# Patient Record
Sex: Female | Born: 1975 | Race: White | Hispanic: No | Marital: Married | Smoking: Never smoker
Health system: Southern US, Community
[De-identification: ages and names within clinical notes are randomized; demographics above are authoritative.]

## PROBLEM LIST (undated history)

## (undated) DIAGNOSIS — M1990 Unspecified osteoarthritis, unspecified site: Secondary | ICD-10-CM

## (undated) DIAGNOSIS — G43909 Migraine, unspecified, not intractable, without status migrainosus: Secondary | ICD-10-CM

## (undated) DIAGNOSIS — R202 Paresthesia of skin: Secondary | ICD-10-CM

## (undated) HISTORY — DX: Migraine, unspecified, not intractable, without status migrainosus: G43.909

## (undated) HISTORY — DX: Paresthesia of skin: R20.2

## (undated) HISTORY — DX: Unspecified osteoarthritis, unspecified site: M19.90

---

## 2004-11-20 ENCOUNTER — Other Ambulatory Visit: Admission: RE | Admit: 2004-11-20 | Discharge: 2004-11-20 | Payer: Self-pay | Admitting: Obstetrics and Gynecology

## 2005-11-19 ENCOUNTER — Other Ambulatory Visit: Admission: RE | Admit: 2005-11-19 | Discharge: 2005-11-19 | Payer: Self-pay | Admitting: *Deleted

## 2008-10-24 ENCOUNTER — Inpatient Hospital Stay (HOSPITAL_COMMUNITY): Admission: AD | Admit: 2008-10-24 | Discharge: 2008-10-26 | Payer: Self-pay | Admitting: Obstetrics and Gynecology

## 2011-01-08 LAB — CBC
HCT: 37.7 % (ref 36.0–46.0)
Hemoglobin: 12.8 g/dL (ref 12.0–15.0)
MCHC: 34 g/dL (ref 30.0–36.0)
MCV: 91.8 fL (ref 78.0–100.0)
Platelets: 130 10*3/uL — ABNORMAL LOW (ref 150–400)
RBC: 4.1 MIL/uL (ref 3.87–5.11)
RDW: 13.8 % (ref 11.5–15.5)
WBC: 15.4 10*3/uL — ABNORMAL HIGH (ref 4.0–10.5)

## 2011-01-08 LAB — RPR: RPR Ser Ql: NONREACTIVE

## 2011-01-09 LAB — CBC
HCT: 30.5 % — ABNORMAL LOW (ref 36.0–46.0)
Hemoglobin: 10.5 g/dL — ABNORMAL LOW (ref 12.0–15.0)
RBC: 3.29 MIL/uL — ABNORMAL LOW (ref 3.87–5.11)
RDW: 13.8 % (ref 11.5–15.5)
WBC: 16.7 10*3/uL — ABNORMAL HIGH (ref 4.0–10.5)

## 2011-09-25 HISTORY — PX: TONSILLECTOMY: SUR1361

## 2018-07-21 ENCOUNTER — Encounter: Payer: Self-pay | Admitting: *Deleted

## 2018-07-23 ENCOUNTER — Ambulatory Visit: Payer: Federal, State, Local not specified - PPO | Admitting: Diagnostic Neuroimaging

## 2018-07-23 ENCOUNTER — Encounter: Payer: Self-pay | Admitting: Diagnostic Neuroimaging

## 2018-07-23 VITALS — BP 122/83 | HR 88 | Ht 62.0 in | Wt 161.0 lb

## 2018-07-23 DIAGNOSIS — R200 Anesthesia of skin: Secondary | ICD-10-CM | POA: Diagnosis not present

## 2018-07-23 NOTE — Progress Notes (Signed)
[facility]  PATIENT: Linda Pierce DOB: 1976-03-21  REFERRING CLINICIAN: Glynda Jaeger HISTORY FROM: patient  REASON FOR VISIT: new consult    HISTORICAL  CHIEF COMPLAINT:  Chief Complaint  Patient presents with  . Right arm numbness    rm 7, New Pt, "right arm numbness, pins/needles, intermittent since early August"    HISTORY OF PRESENT ILLNESS:   42 year old female here for evaluation of numbness and tingling.  August 2019 patient noticed onset of intermittent numbness and tingling sensation in her right upper arm from her right shoulder to her right elbow.  Symptoms could last minutes up to several hours at a time.  Symptoms occurring multiple times per day.  Symptoms seem to be worse if she lays on her right side or moves her arm in a certain position.  Stretching for moving her head and right arm seem to alleviate symptoms.  Symptoms are fairly stable to slightly improved since August 2019.  No weakness in the right arm.  No neck pain.  No symptoms in the face, left arm, bilateral lower extremities.  No specific triggering or aggravating factors otherwise noted.  No prodromal accidents, injuries or traumas.   REVIEW OF SYSTEMS: Full 14 system review of systems performed and negative with exception of: Numbness.  ALLERGIES: Allergies  Allergen Reactions  . Montelukast Other (See Comments)    Headache  . Penicillins Hives    HOME MEDICATIONS: Outpatient Medications Prior to Visit  Medication Sig Dispense Refill  . CAMILA 0.35 MG tablet Take 1 tablet by mouth daily.  3  . Cetirizine HCl 10 MG CAPS Take by mouth as needed. As needed    . diclofenac (VOLTAREN) 75 MG EC tablet Take by mouth as needed.    . diclofenac sodium (VOLTAREN) 1 % GEL as needed.    . eletriptan (RELPAX) 40 MG tablet TAKE 1 TABLET (40 MG TOTAL) BY MOUTH AS NEEDED. MAY REPEAT IN 2 HOURS IF NECESSARY  0  . meclizine (ANTIVERT) 25 MG tablet Take by mouth as needed.    Marland Kitchen scopolamine  (TRANSDERM-SCOP) 1 MG/3DAYS Place onto the skin as needed.     No facility-administered medications prior to visit.     PAST MEDICAL HISTORY: Past Medical History:  Diagnosis Date  . Arthritis    knee, left big toe  . Migraines    monthly acupuncture, trigger by hormones  . Paresthesia of arm    right    PAST SURGICAL HISTORY: Past Surgical History:  Procedure Laterality Date  . TONSILLECTOMY  2013    FAMILY HISTORY: Family History  Problem Relation Age of Onset  . Heart Problems Father        artificial aortic valve  . Cancer Paternal Uncle        pancreatic  . Breast cancer Maternal Grandmother   . Cancer Paternal Grandmother        brain  . Cancer Paternal Uncle        colon    SOCIAL HISTORY: Social History   Socioeconomic History  . Marital status: Married    Spouse name: Janyth Pupa  . Number of children: 1  . Years of education: Not on file  . Highest education level: Master's degree (e.g., MA, MS, MEng, MEd, MSW, MBA)  Occupational History    Comment: [employer]  Social Needs  . Financial resource strain: Not on file  . Food insecurity:    Worry: Not on file    Inability: Not on file  .  Transportation needs:    Medical: Not on file    Non-medical: Not on file  Tobacco Use  . Smoking status: Never Smoker  . Smokeless tobacco: Never Used  Substance and Sexual Activity  . Alcohol use: Yes    Comment: 07/23/18 < 1 a day  . Drug use: Never  . Sexual activity: Not on file  Lifestyle  . Physical activity:    Days per week: Not on file    Minutes per session: Not on file  . Stress: Not on file  Relationships  . Social connections:    Talks on phone: Not on file    Gets together: Not on file    Attends religious service: Not on file    Active member of club or organization: Not on file    Attends meetings of clubs or organizations: Not on file    Relationship status: Not on file  . Intimate partner violence:    Fear of current or ex partner: Not on  file    Emotionally abused: Not on file    Physically abused: Not on file    Forced sexual activity: Not on file  Other Topics Concern  . Not on file  Social History Narrative   Lives with husband, child    Caffeine- coffee, 1 cup, sodas 1-4 a day     PHYSICAL EXAM  GENERAL EXAM/CONSTITUTIONAL: Vitals:  Vitals:   07/23/18 0957  BP: 122/83  Pulse: 88  Weight: 161 lb (73 kg)  Height: 5\' 2"  (1.575 m)     Body mass index is 29.45 kg/m. Wt Readings from Last 3 Encounters:  07/23/18 161 lb (73 kg)     Patient is in no distress; well developed, nourished and groomed; neck is supple  CARDIOVASCULAR:  Examination of carotid arteries is normal; no carotid bruits  Regular rate and rhythm, no murmurs  Examination of peripheral vascular system by observation and palpation is normal  EYES:  Ophthalmoscopic exam of optic discs and posterior segments is normal; no papilledema or hemorrhages  Visual Acuity Screening   Right eye Left eye Both eyes  Without correction:     With correction: 20/30 20/20      MUSCULOSKELETAL:  Gait, strength, tone, movements noted in Neurologic exam below  NEUROLOGIC: MENTAL STATUS:  No flowsheet data found.  awake, alert, oriented to person, place and time  recent and remote memory intact  normal attention and concentration  language fluent, comprehension intact, naming intact  fund of knowledge appropriate  CRANIAL NERVE:   2nd - no papilledema on fundoscopic exam  2nd, 3rd, 4th, 6th - pupils equal and reactive to light, visual fields full to confrontation, extraocular muscles intact, no nystagmus  5th - facial sensation symmetric  7th - facial strength symmetric  8th - hearing intact  9th - palate elevates symmetrically, uvula midline  11th - shoulder shrug symmetric  12th - tongue protrusion midline  MOTOR:   normal bulk and tone, full strength in the BUE, BLE  SENSORY:   normal and symmetric to light touch,  temperature, vibration, pinprick  COORDINATION:   finger-nose-finger, fine finger movements normal  REFLEXES:   deep tendon reflexes present and symmetric  GAIT/STATION:   narrow based gait; able tandem; romberg is negative     DIAGNOSTIC DATA (LABS, IMAGING, TESTING) - I reviewed patient records, labs, notes, testing and imaging myself where available.  Lab Results  Component Value Date   WBC 16.7 (H) 10/25/2008   HGB 10.5 DELTA  CHECK NOTED (L) 10/25/2008   HCT 30.5 (L) 10/25/2008   MCV 92.6 10/25/2008   PLT 112 (L) 10/25/2008   No results found for: NA, K, CL, CO2, GLUCOSE, BUN, CREATININE, CALCIUM, PROT, ALBUMIN, AST, ALT, ALKPHOS, BILITOT, GFRNONAA, GFRAA No results found for: CHOL, HDL, LDLCALC, LDLDIRECT, TRIG, CHOLHDL No results found for: ZOXW9U No results found for: VITAMINB12 No results found for: TSH     ASSESSMENT AND PLAN  42 y.o. year old female here with mild intermittent numbness and tingling in right upper extremity since August 2019.  Symptoms seem to be related to position and pressure.  Could represent intermittent nerve or nerve root compression.  Neurologic examination unremarkable today.  Ddx: intermittent cervical radiculopathy vs peripheral neuropathy  1. Right arm numbness     PLAN:  - monitor symptoms for now over next 4-6 weeks - may consider EMG/NCS and MRI cervical spine if symptoms do not improve  Return if symptoms worsen or fail to improve.    Suanne Marker, MD 07/23/2018, 10:27 AM Certified in Neurology, Neurophysiology and Neuroimaging  [facility] [address] [place], [place] [address] [phone]

## 2018-07-23 NOTE — Patient Instructions (Addendum)
-   monitor symptoms over next 4-6 weeks  - optimize ergonomics, core exercises, stretching, yoga  - may consider EMG/NCS and MRI cervical spine

## 2019-07-27 ENCOUNTER — Other Ambulatory Visit: Payer: Self-pay | Admitting: Obstetrics and Gynecology

## 2019-07-27 DIAGNOSIS — R928 Other abnormal and inconclusive findings on diagnostic imaging of breast: Secondary | ICD-10-CM

## 2019-07-30 ENCOUNTER — Ambulatory Visit
Admission: RE | Admit: 2019-07-30 | Discharge: 2019-07-30 | Disposition: A | Discharge status: Home / Self Care | Payer: Federal, State, Local not specified - PPO | Source: Ambulatory Visit | Attending: Obstetrics and Gynecology | Admitting: Obstetrics and Gynecology

## 2019-07-30 ENCOUNTER — Other Ambulatory Visit: Payer: Self-pay

## 2019-07-30 DIAGNOSIS — R928 Other abnormal and inconclusive findings on diagnostic imaging of breast: Secondary | ICD-10-CM

## 2020-05-17 ENCOUNTER — Other Ambulatory Visit: Payer: Self-pay | Admitting: Obstetrics and Gynecology

## 2020-05-17 DIAGNOSIS — Z1231 Encounter for screening mammogram for malignant neoplasm of breast: Secondary | ICD-10-CM

## 2020-07-25 ENCOUNTER — Ambulatory Visit
Admission: RE | Admit: 2020-07-25 | Discharge: 2020-07-25 | Disposition: A | Discharge status: Home / Self Care | Payer: Federal, State, Local not specified - PPO | Source: Ambulatory Visit | Attending: Obstetrics and Gynecology | Admitting: Obstetrics and Gynecology

## 2020-07-25 ENCOUNTER — Other Ambulatory Visit: Payer: Self-pay

## 2020-07-25 DIAGNOSIS — Z1231 Encounter for screening mammogram for malignant neoplasm of breast: Secondary | ICD-10-CM

## 2020-10-12 ENCOUNTER — Encounter (HOSPITAL_BASED_OUTPATIENT_CLINIC_OR_DEPARTMENT_OTHER): Payer: Self-pay | Admitting: *Deleted

## 2020-10-12 ENCOUNTER — Other Ambulatory Visit: Payer: Self-pay

## 2020-10-12 ENCOUNTER — Emergency Department (HOSPITAL_BASED_OUTPATIENT_CLINIC_OR_DEPARTMENT_OTHER): Payer: Federal, State, Local not specified - PPO

## 2020-10-12 ENCOUNTER — Emergency Department (HOSPITAL_BASED_OUTPATIENT_CLINIC_OR_DEPARTMENT_OTHER)
Admission: EM | Admit: 2020-10-12 | Discharge: 2020-10-12 | Disposition: A | Discharge status: Home / Self Care | Payer: Federal, State, Local not specified - PPO | Attending: Emergency Medicine | Admitting: Emergency Medicine

## 2020-10-12 DIAGNOSIS — E876 Hypokalemia: Secondary | ICD-10-CM

## 2020-10-12 DIAGNOSIS — Z8616 Personal history of COVID-19: Secondary | ICD-10-CM | POA: Diagnosis not present

## 2020-10-12 DIAGNOSIS — F419 Anxiety disorder, unspecified: Secondary | ICD-10-CM | POA: Insufficient documentation

## 2020-10-12 DIAGNOSIS — R002 Palpitations: Secondary | ICD-10-CM | POA: Insufficient documentation

## 2020-10-12 DIAGNOSIS — R000 Tachycardia, unspecified: Secondary | ICD-10-CM | POA: Diagnosis not present

## 2020-10-12 LAB — COMPREHENSIVE METABOLIC PANEL
ALT: 26 U/L (ref 0–44)
AST: 25 U/L (ref 15–41)
Albumin: 4.5 g/dL (ref 3.5–5.0)
Alkaline Phosphatase: 84 U/L (ref 38–126)
Anion gap: 12 (ref 5–15)
BUN: 11 mg/dL (ref 6–20)
CO2: 24 mmol/L (ref 22–32)
Calcium: 8.9 mg/dL (ref 8.9–10.3)
Chloride: 100 mmol/L (ref 98–111)
Creatinine, Ser: 0.83 mg/dL (ref 0.44–1.00)
GFR, Estimated: >60 mL/min (ref >60)
Glucose, Bld: 118 mg/dL — ABNORMAL HIGH (ref 70–99)
Potassium: 2.4 mmol/L — CL (ref 3.5–5.1)
Sodium: 136 mmol/L (ref 135–145)
Total Bilirubin: 0.2 mg/dL — ABNORMAL LOW (ref 0.3–1.2)
Total Protein: 7.6 g/dL (ref 6.5–8.1)

## 2020-10-12 LAB — CBC WITH DIFFERENTIAL/PLATELET
Abs Immature Granulocytes: 0.06 10*3/uL (ref 0.00–0.07)
Basophils Absolute: 0.1 10*3/uL (ref 0.0–0.1)
Basophils Relative: 1 %
Eosinophils Absolute: 0.1 10*3/uL (ref 0.0–0.5)
Eosinophils Relative: 0 %
HCT: 42.4 % (ref 36.0–46.0)
Hemoglobin: 14.3 g/dL (ref 12.0–15.0)
Immature Granulocytes: 0 %
Lymphocytes Relative: 25 %
Lymphs Abs: 3.5 10*3/uL (ref 0.7–4.0)
MCH: 29.2 pg (ref 26.0–34.0)
MCHC: 33.7 g/dL (ref 30.0–36.0)
MCV: 86.7 fL (ref 80.0–100.0)
Monocytes Absolute: 0.8 10*3/uL (ref 0.1–1.0)
Monocytes Relative: 5 %
Neutro Abs: 9.9 10*3/uL — ABNORMAL HIGH (ref 1.7–7.7)
Neutrophils Relative %: 69 %
Platelets: 299 10*3/uL (ref 150–400)
RBC: 4.89 MIL/uL (ref 3.87–5.11)
RDW: 12.4 % (ref 11.5–15.5)
WBC: 14.5 10*3/uL — ABNORMAL HIGH (ref 4.0–10.5)
nRBC: 0 % (ref 0.0–0.2)

## 2020-10-12 LAB — TROPONIN I (HIGH SENSITIVITY): Troponin I (High Sensitivity): 3 ng/L (ref <18)

## 2020-10-12 LAB — D-DIMER, QUANTITATIVE: D-Dimer, Quant: 0.39 ug/mL-FEU (ref 0.00–0.50)

## 2020-10-12 MED ORDER — DOXYCYCLINE HYCLATE 100 MG PO CAPS: 100.0000 mg | ORAL_CAPSULE | Freq: Two times a day (BID) | ORAL | 0 refills | Status: AC

## 2020-10-12 MED ORDER — POTASSIUM CHLORIDE 10 MEQ/100ML IV SOLN
10.0000 meq | INTRAVENOUS | Status: AC
  Administered 2020-10-12 (×2): 10 meq via INTRAVENOUS
  Filled 2020-10-12 (×2): qty 100

## 2020-10-12 MED ORDER — POTASSIUM CHLORIDE CRYS ER 20 MEQ PO TBCR
40.0000 meq | EXTENDED_RELEASE_TABLET | Freq: Once | ORAL | Status: AC
  Administered 2020-10-12: 40 meq via ORAL
  Filled 2020-10-12: qty 2

## 2020-10-12 MED ORDER — POTASSIUM CHLORIDE ER 20 MEQ PO TBCR: 20.0000 meq | EXTENDED_RELEASE_TABLET | Freq: Every day | ORAL | 0 refills | Status: AC

## 2020-10-12 MED ORDER — LORAZEPAM 2 MG/ML IJ SOLN: 1.0000 mg | Freq: Once | INTRAMUSCULAR | Status: DC

## 2020-10-12 MED ORDER — POTASSIUM CHLORIDE ER 20 MEQ PO TBCR: 20.0000 meq | EXTENDED_RELEASE_TABLET | Freq: Every day | ORAL | 0 refills | Status: DC

## 2020-10-12 MED ORDER — SODIUM CHLORIDE 0.9 % IV BOLUS
1000.0000 mL | Freq: Once | INTRAVENOUS | Status: AC
  Administered 2020-10-12: 1000 mL via INTRAVENOUS

## 2020-10-12 NOTE — ED Triage Notes (Signed)
C/o palpations x 1 hr , denies SOb. HR 170 in triage

## 2020-10-12 NOTE — ED Notes (Signed)
ED Provider at bedside. 

## 2020-10-12 NOTE — ED Provider Notes (Signed)
[facility] EMERGENCY DEPARTMENT Provider Note   CSN: [id number] Arrival date & time: 10/12/20  1435     History Chief Complaint  Patient presents with  . Palpitations    Linda Pierce is a 45 y.o. female.  The history is provided by the patient.  Palpitations Palpitations quality:  Fast Onset quality:  Sudden Duration:  1 hour Timing:  Constant Progression:  Improving Chronicity:  New Context: anxiety   Context comment:  Covid last month Relieved by:  Nothing Worsened by:  Nothing Associated symptoms: no back pain, no chest pain, no cough, no diaphoresis, no dizziness, no leg pain, no malaise/fatigue, no orthopnea, no PND, no shortness of breath and no vomiting   Risk factors: no diabetes mellitus, no heart disease, no hx of atrial fibrillation and no hx of PE        Past Medical History:  Diagnosis Date  . Arthritis    knee, left big toe  . Migraines    monthly acupuncture, trigger by hormones  . Paresthesia of arm    right    There are no problems to display for this patient.   Past Surgical History:  Procedure Laterality Date  . TONSILLECTOMY  2013     OB History   No obstetric history on file.     Family History  Problem Relation Age of Onset  . Heart Problems Father        artificial aortic valve  . Cancer Paternal Uncle        pancreatic  . Breast cancer Maternal Grandmother   . Cancer Paternal Grandmother        brain  . Cancer Paternal Uncle        colon    Social History   Tobacco Use  . Smoking status: Never Smoker  . Smokeless tobacco: Never Used  Substance Use Topics  . Alcohol use: Yes    Comment: 07/23/18 < 1 a day  . Drug use: Never    Home Medications Prior to Admission medications   Medication Sig Start Date End Date Taking? Authorizing Provider  doxycycline (VIBRAMYCIN) 100 MG capsule Take 1 capsule (100 mg total) by mouth 2 (two) times daily for 7 days. 10/12/20 10/19/20 Yes Kendale Rembold, DO   potassium chloride 20 MEQ TBCR Take 20 mEq by mouth daily for 5 days. 10/12/20 10/17/20 Yes Shamona Wirtz, DO  CAMILA 0.35 MG tablet Take 1 tablet by mouth daily. 06/18/18   [provider]  Cetirizine HCl 10 MG CAPS Take by mouth as needed. As needed    [provider]  diclofenac (VOLTAREN) 75 MG EC tablet Take by mouth as needed. 03/21/18   [provider]  diclofenac sodium (VOLTAREN) 1 % GEL as needed. 09/11/16   [provider]  eletriptan (RELPAX) 40 MG tablet TAKE 1 TABLET (40 MG TOTAL) BY MOUTH AS NEEDED. MAY REPEAT IN 2 HOURS IF NECESSARY 04/30/18   [provider]  meclizine (ANTIVERT) 25 MG tablet Take by mouth as needed. 03/21/18   [provider]  scopolamine (TRANSDERM-SCOP) 1 MG/3DAYS Place onto the skin as needed. 03/21/18   [provider]    Allergies    Montelukast and Penicillins  Review of Systems   Review of Systems  Constitutional: Negative for chills, diaphoresis, fever and malaise/fatigue.  HENT: Negative for ear pain and sore throat.   Eyes: Negative for pain and visual disturbance.  Respiratory: Negative for cough and shortness of breath.   Cardiovascular:  Positive for palpitations. Negative for chest pain, orthopnea and PND.  Gastrointestinal: Negative for abdominal pain and vomiting.  Genitourinary: Negative for dysuria and hematuria.  Musculoskeletal: Negative for arthralgias and back pain.  Skin: Negative for color change and rash.  Neurological: Negative for dizziness, seizures and syncope.  All other systems reviewed and are negative.   Physical Exam Updated Vital Signs  ED Triage Vitals  Enc Vitals Group     BP 10/12/20 1445 (!) 189/96     Pulse Rate 10/12/20 1442 (!) 171     Resp 10/12/20 1442 20     Temp --      Temp Source 10/12/20 1442 Oral     SpO2 10/12/20 1442 100 %     Weight 10/12/20 1440 168 lb (76.2 kg)     Height 10/12/20 1440 5\' 2"  (1.575 m)     Head Circumference --       Peak Flow --      Pain Score 10/12/20 1440 0     Pain Loc --      Pain Edu? --      Excl. in GC? --     Physical Exam Vitals and nursing note reviewed.  Constitutional:      General: She is not in acute distress.    Appearance: She is well-developed and well-nourished. She is not ill-appearing.  HENT:     Head: Normocephalic and atraumatic.     Nose: Nose normal.     Mouth/Throat:     Mouth: Mucous membranes are moist.  Eyes:     Extraocular Movements: Extraocular movements intact.     Conjunctiva/sclera: Conjunctivae normal.     Pupils: Pupils are equal, round, and reactive to light.  Cardiovascular:     Rate and Rhythm: Regular rhythm. Tachycardia present.     Pulses: Normal pulses.     Heart sounds: Normal heart sounds. No murmur heard. No friction rub.  Pulmonary:     Effort: Pulmonary effort is normal. No respiratory distress.     Breath sounds: Normal breath sounds.  Abdominal:     Palpations: Abdomen is soft.     Tenderness: There is no abdominal tenderness.  Musculoskeletal:        General: No edema.     Cervical back: Normal range of motion and neck supple.  Skin:    General: Skin is warm and dry.     Capillary Refill: Capillary refill takes less than 2 seconds.  Neurological:     Mental Status: She is alert and oriented to person, place, and time.     Cranial Nerves: No cranial nerve deficit.     Sensory: No sensory deficit.     Motor: No weakness.     Coordination: Coordination normal.  Psychiatric:        Mood and Affect: Mood is anxious.     ED Results / Procedures / Treatments   Labs (all labs ordered are listed, but only abnormal results are displayed) Labs Reviewed  CBC WITH DIFFERENTIAL/PLATELET - Abnormal; Notable for the following components:      Result Value   WBC 14.5 (*)    Neutro Abs 9.9 (*)    All other components within normal limits  COMPREHENSIVE METABOLIC PANEL - Abnormal; Notable for the following components:   Potassium 2.4  (*)    Glucose, Bld 118 (*)    Total Bilirubin 0.2 (*)    All other components within normal limits  D-DIMER, QUANTITATIVE (NOT AT [facility])  TROPONIN I (HIGH SENSITIVITY)    EKG EKG Interpretation  Date/Time:  Wednesday October 12 2020 14:42:55 EST Ventricular Rate:  156 PR Interval:    QRS Duration: 111 QT Interval:  304 QTC Calculation: 490 R Axis:   78 Text Interpretation: Sinus tachycardia Atrial premature complexes Borderline ST depression, diffuse leads Confirmed by Virgina Norfolk [phone]) on 10/12/2020 2:53:41 PM   Radiology DG Chest Portable 1 View  Result Date: 10/12/2020 CLINICAL DATA:  Shortness of breath. EXAM: PORTABLE CHEST 1 VIEW COMPARISON:  No recent prior. FINDINGS: Mediastinum and hilar structures normal. Heart size normal. Mild peribronchial cuffing and basilar interstitial prominence. Mild bronchitis/pneumonitis cannot be completely excluded. No pleural effusion or pneumothorax. IMPRESSION: Mild peribronchial cuffing and basilar interstitial prominence. Mild bronchitis/pneumonitis cannot be completely excluded. Electronically Signed   By: Maisie Fus  Register   On: 10/12/2020 15:19    Procedures Procedures (including critical care time)  Medications Ordered in ED Medications  sodium chloride 0.9 % bolus 1,000 mL (1,000 mLs Intravenous New Bag/Given 10/12/20 1513)  potassium chloride 10 mEq in 100 mL IVPB (0 mEq Intravenous Stopped 10/12/20 1731)  potassium chloride SA (KLOR-CON) CR tablet 40 mEq (40 mEq Oral Given 10/12/20 1557)    ED Course  I have reviewed the triage vital signs and the nursing notes.  Pertinent labs & imaging results that were available during my care of the patient were reviewed by me and considered in my medical decision making (see chart for details).    MDM Rules/Calculators/A&P                          Linda Pierce is a 45 year old female with no significant medical history presents to the ED with palpitations.  Heart rate in the 170s  upon arrival however upon my evaluation heart rate in the low 100s.  EKG shows sinus tachycardia.  Patient admits to being anxious.  Had COVID several weeks ago.  D-dimer negative and doubt PE.  Troponin normal and doubt cardiac process.  Not having chest pain. Doubt ACS. Chest x-ray did still show some inflammation could be sequelae of COVID but will treat with antibiotics as she does have a mild white count.  She does not have a fever or cough or sputum production.  Heart rate improved following IV fluids.  Potassium was 2.4 and patient was given repletion.  Will prescribe oral potassium as well.  Otherwise lab work was unremarkable.  Given return precautions and discharged in condition.  This chart was dictated using voice recognition software.  Despite best efforts to proofread,  errors can occur which can change the documentation meaning.    Final Clinical Impression(s) / ED Diagnoses Final diagnoses:  Palpitations  Hypokalemia  Anxiety    Rx / DC Orders ED Discharge Orders         Ordered    doxycycline (VIBRAMYCIN) 100 MG capsule  2 times daily        10/12/20 1814    potassium chloride 20 MEQ TBCR  Daily        10/12/20 1816           Virgina Norfolk, DO 10/12/20 1816

## 2021-07-27 ENCOUNTER — Other Ambulatory Visit: Payer: Self-pay | Admitting: Obstetrics and Gynecology

## 2021-07-27 DIAGNOSIS — Z1231 Encounter for screening mammogram for malignant neoplasm of breast: Secondary | ICD-10-CM

## 2021-08-29 ENCOUNTER — Ambulatory Visit: Payer: Federal, State, Local not specified - PPO

## 2021-08-31 ENCOUNTER — Ambulatory Visit: Payer: Federal, State, Local not specified - PPO

## 2021-10-05 ENCOUNTER — Ambulatory Visit
Admission: RE | Admit: 2021-10-05 | Discharge: 2021-10-05 | Disposition: A | Discharge status: Home / Self Care | Payer: Federal, State, Local not specified - PPO | Source: Ambulatory Visit | Attending: Obstetrics and Gynecology | Admitting: Obstetrics and Gynecology

## 2021-10-05 DIAGNOSIS — Z1231 Encounter for screening mammogram for malignant neoplasm of breast: Secondary | ICD-10-CM

## 2023-07-30 IMAGING — MG MM DIGITAL SCREENING BILAT W/ TOMO AND CAD
6 of 10 series · 6 of 30 positions shown · non-contrast
Comparison: Previous exam(s).

CLINICAL DATA: Screening.

EXAM:
DIGITAL SCREENING BILATERAL MAMMOGRAM WITH TOMOSYNTHESIS AND CAD
TECHNIQUE: Bilateral screening digital craniocaudal and mediolateral oblique
mammograms were obtained. Bilateral screening digital breast
tomosynthesis was performed. The images were evaluated with
computer-aided detection.

[L MLO synth-2D]
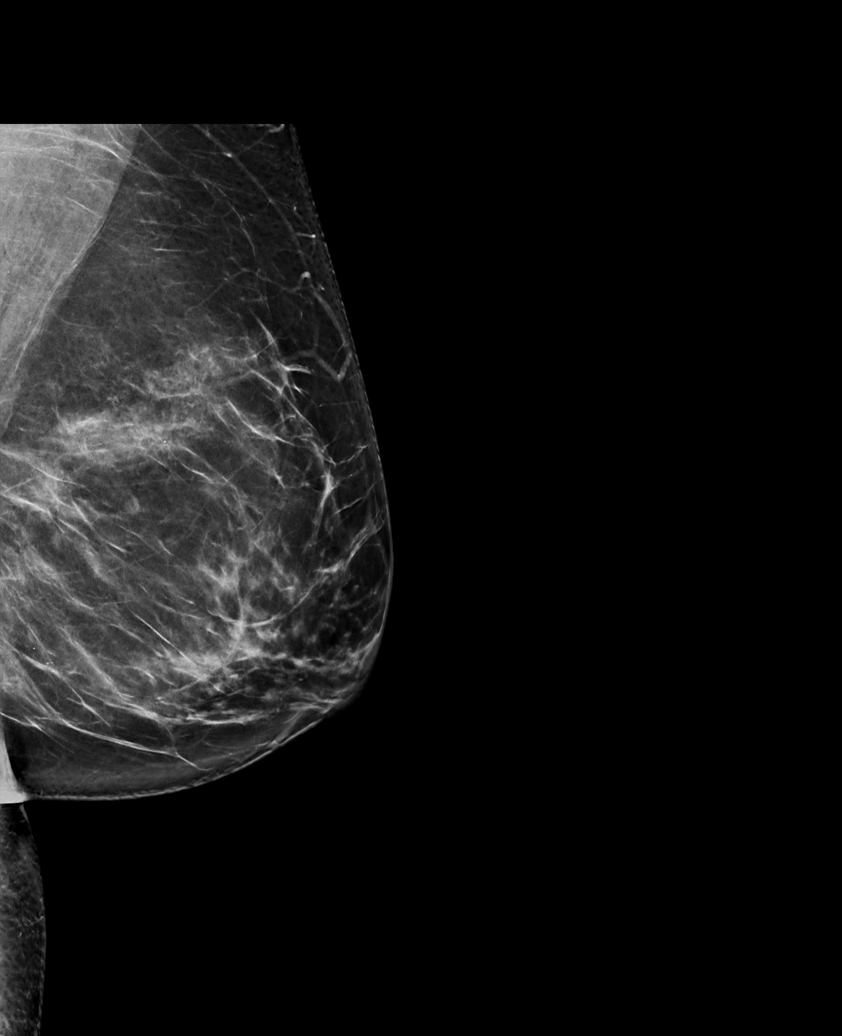

[R MLO synth-2D (1 of 2)]
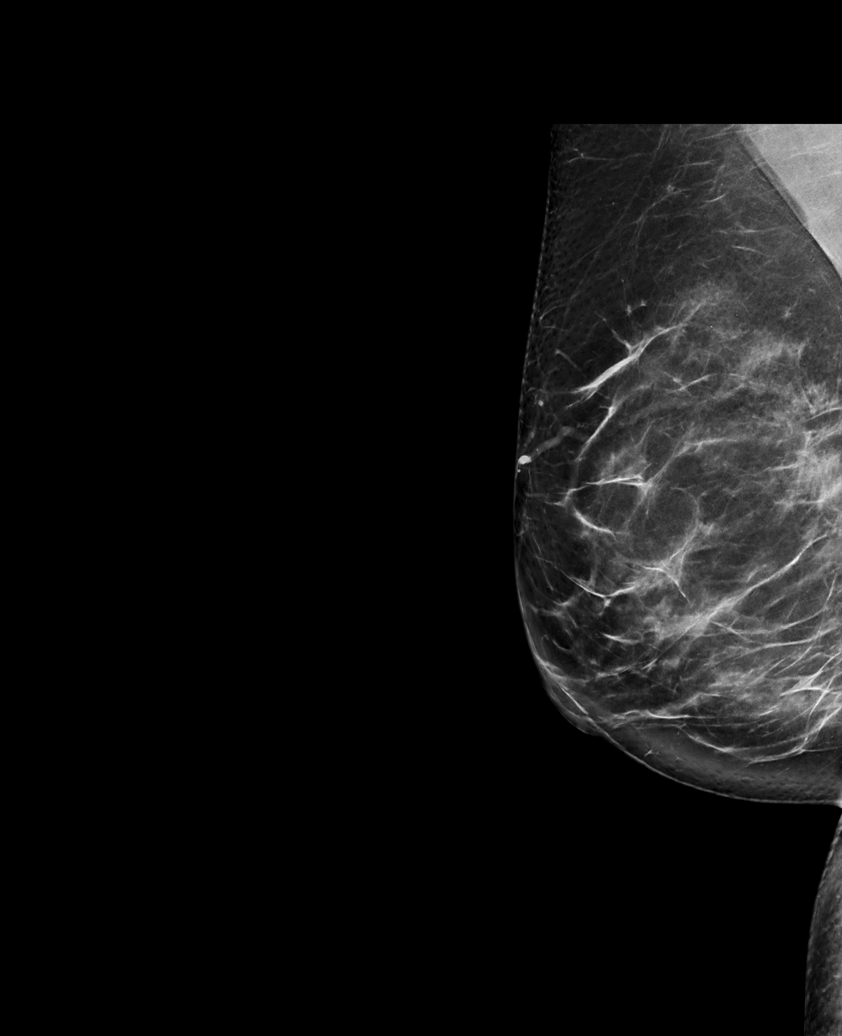

[L CC synth-2D]
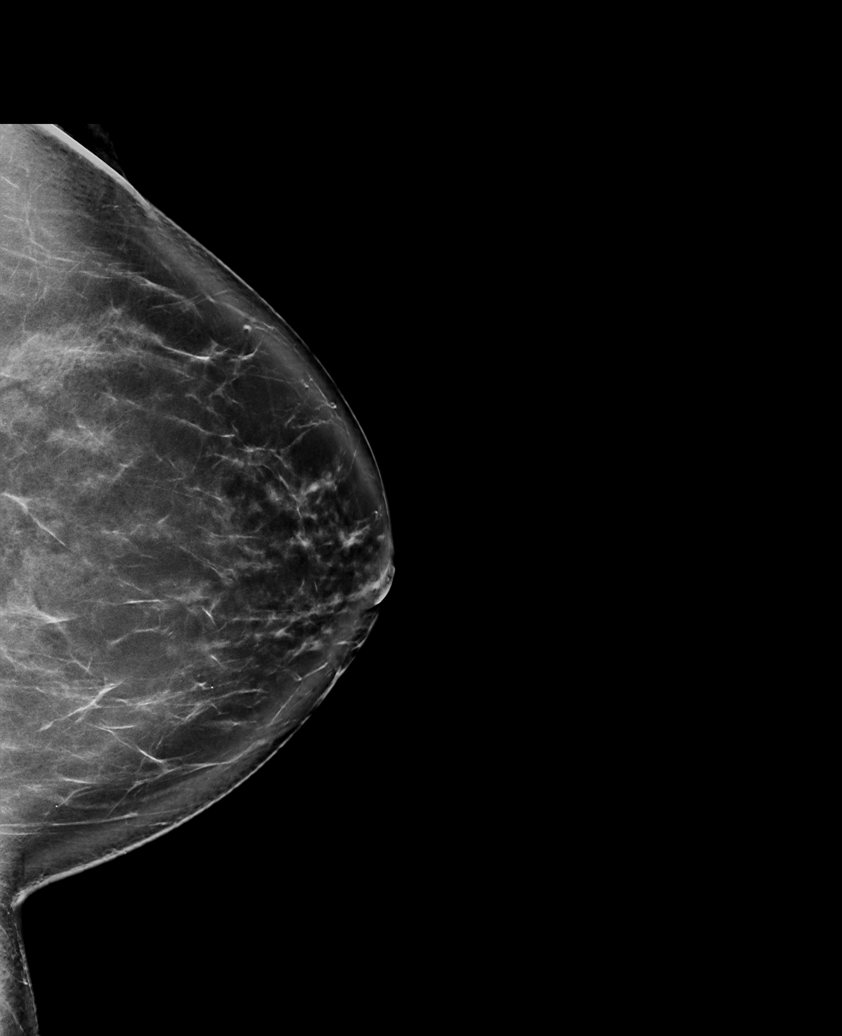

[R CC synth-2D]
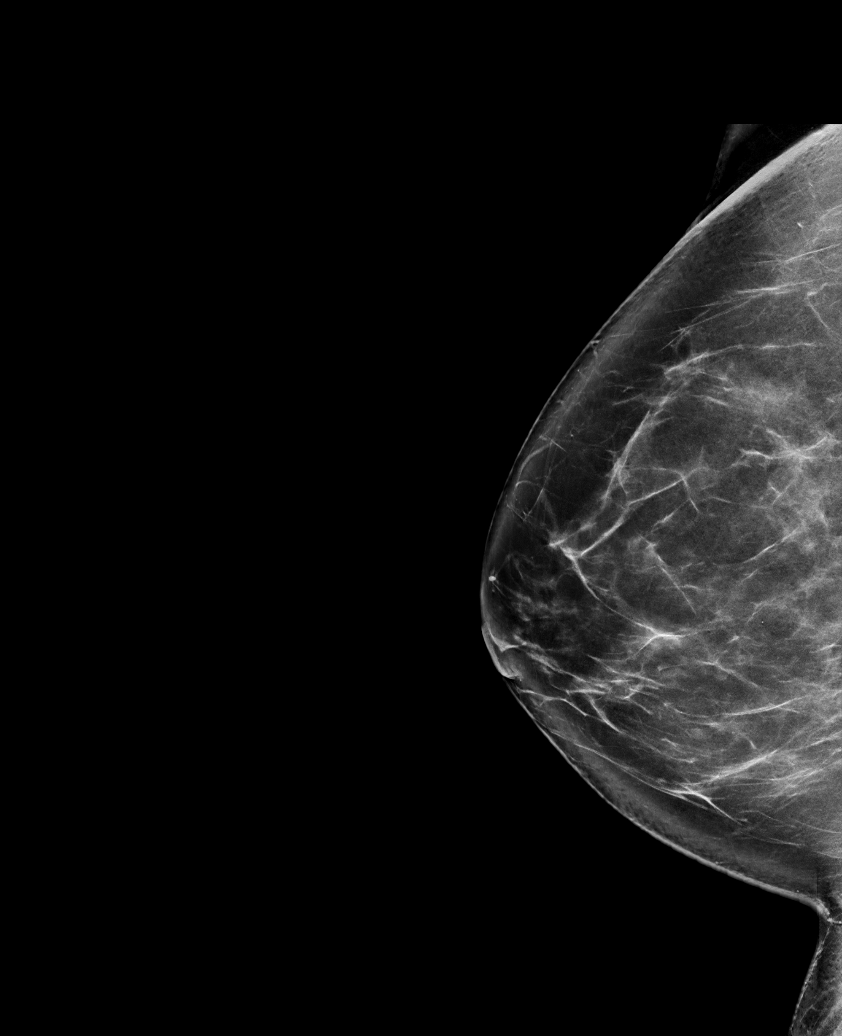

[R MLO synth-2D (2 of 2)]
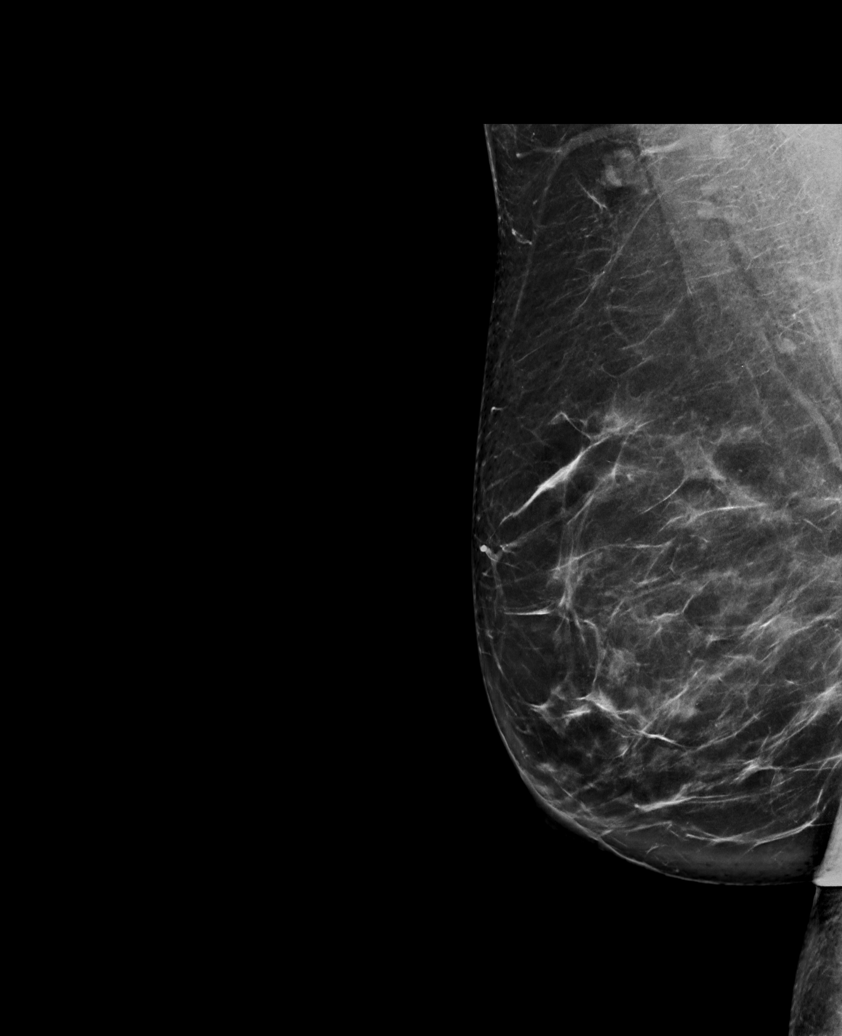

[R CC tomo · tomo slice 46/91.0]
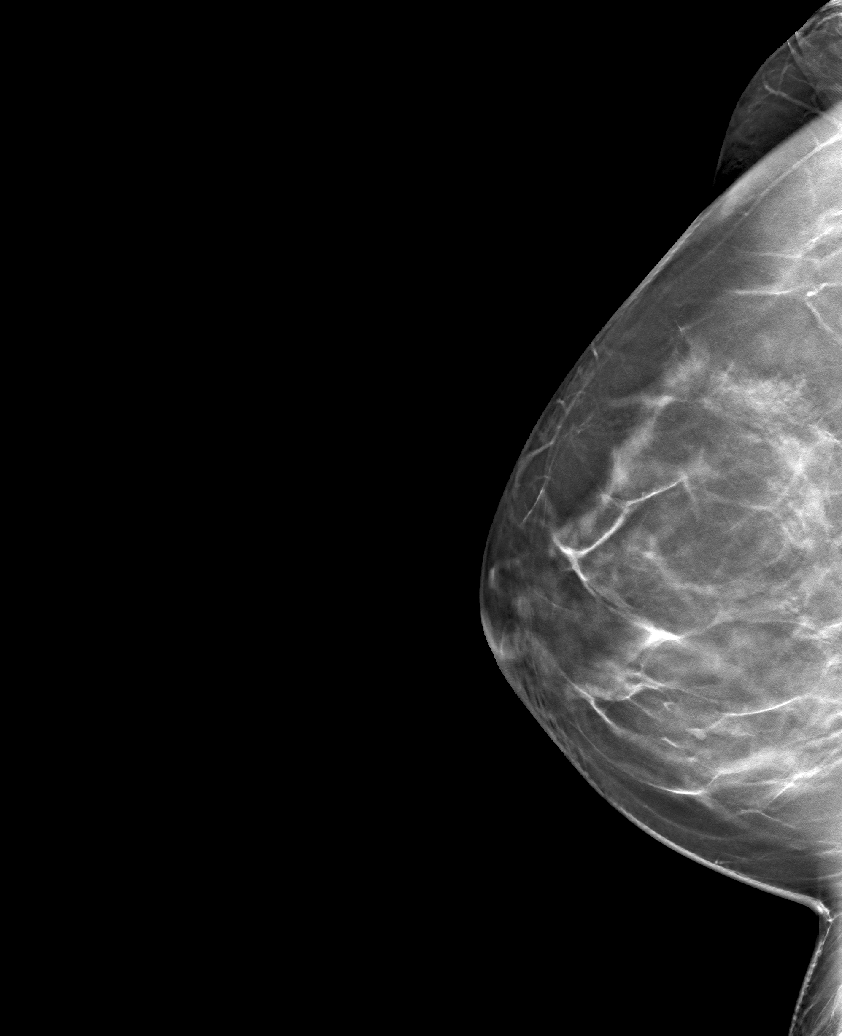

[6 of 30 positions shown; findings below may reference images not displayed]

ACR Breast Density Category b: There are scattered areas of
fibroglandular density.
FINDINGS: There are no findings suspicious for malignancy.
IMPRESSION: No mammographic evidence of malignancy. A result letter of this
screening mammogram will be mailed directly to the patient.

RECOMMENDATION:
Screening mammogram in one year. (Code:51-O-LD2)

BI-RADS CATEGORY  1: Negative.
